# Patient Record
Sex: Female | Born: 1995 | Race: White | Hispanic: No | Marital: Single | Smoking: Never smoker
Health system: Southern US, Community
[De-identification: ages and names within clinical notes are randomized; demographics above are authoritative.]

---
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
## 2018-08-30 DIAGNOSIS — Z79899 Other long term (current) drug therapy: Secondary | ICD-10-CM | POA: Diagnosis not present

## 2018-08-30 DIAGNOSIS — L709 Acne, unspecified: Secondary | ICD-10-CM | POA: Diagnosis not present

## 2019-02-25 DIAGNOSIS — J020 Streptococcal pharyngitis: Secondary | ICD-10-CM | POA: Diagnosis not present

## 2019-02-25 DIAGNOSIS — H6121 Impacted cerumen, right ear: Secondary | ICD-10-CM | POA: Diagnosis not present

## 2019-02-25 DIAGNOSIS — J029 Acute pharyngitis, unspecified: Secondary | ICD-10-CM | POA: Diagnosis not present

## 2019-02-28 DIAGNOSIS — L709 Acne, unspecified: Secondary | ICD-10-CM | POA: Diagnosis not present

## 2019-08-29 DIAGNOSIS — L700 Acne vulgaris: Secondary | ICD-10-CM | POA: Diagnosis not present

## 2019-12-05 DIAGNOSIS — B354 Tinea corporis: Secondary | ICD-10-CM | POA: Diagnosis not present

## 2020-02-28 DIAGNOSIS — L700 Acne vulgaris: Secondary | ICD-10-CM | POA: Diagnosis not present

## 2020-04-19 ENCOUNTER — Encounter: Payer: Self-pay | Admitting: Obstetrics & Gynecology

## 2020-04-19 ENCOUNTER — Ambulatory Visit (INDEPENDENT_AMBULATORY_CARE_PROVIDER_SITE_OTHER): Payer: BC Managed Care – PPO | Admitting: Obstetrics & Gynecology

## 2020-04-19 ENCOUNTER — Other Ambulatory Visit (HOSPITAL_COMMUNITY)
Admission: RE | Admit: 2020-04-19 | Discharge: 2020-04-19 | Disposition: A | Discharge status: Home / Self Care | Payer: BC Managed Care – PPO | Source: Ambulatory Visit | Attending: Obstetrics & Gynecology | Admitting: Obstetrics & Gynecology

## 2020-04-19 ENCOUNTER — Other Ambulatory Visit: Payer: Self-pay

## 2020-04-19 VITALS — BP 93/48 | HR 80 | Ht 68.0 in | Wt 130.0 lb

## 2020-04-19 DIAGNOSIS — Z113 Encounter for screening for infections with a predominantly sexual mode of transmission: Secondary | ICD-10-CM | POA: Insufficient documentation

## 2020-04-19 DIAGNOSIS — Z3009 Encounter for other general counseling and advice on contraception: Secondary | ICD-10-CM | POA: Diagnosis not present

## 2020-04-19 DIAGNOSIS — Z0000 Encounter for general adult medical examination without abnormal findings: Secondary | ICD-10-CM

## 2020-04-19 NOTE — Progress Notes (Signed)
Subjective:     Kristin Friedman is a 25 y.o. female here for a routine exam.  Current complaints: This is pts first GYN exam. She is sexually active. She has been on OCPs since junior year of HS. She is healthy and has no h/o STIs.  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 Her mother is a pt her and referred her.     Gynecologic History Patient's last menstrual period was 04/09/2020 (exact date). Contraception: OCP (estrogen/progesterone) Last Pap: n/a Last mammogram: n/a  Obstetric History OB History  Gravida Para Term Preterm AB Living  0 0 0 0 0 0  SAB IAB Ectopic Multiple Live Births  0 0 0 0 0    The following portions of the patient's history were reviewed and updated as appropriate: allergies, current medications, past family history, past medical history, past social history, past surgical history and problem list.  Review of Systems Pertinent items are noted in HPI.    Objective:  BP (!) 93/48    Pulse 80    Ht 5\' 8"  (1.727 m)    Wt 130 lb (59 kg)    LMP 04/09/2020 (Exact Date)    BMI 19.77 kg/m  General Appearance:    Alert, cooperative, no distress, appears stated age  Head:    Normocephalic, without obvious abnormality, atraumatic  Eyes:    conjunctiva/corneas clear, EOM's intact, both eyes  Ears:    Normal external ear canals, both ears  Nose:   Nares normal, septum midline, mucosa normal, no drainage    or sinus tenderness  Throat:   Lips, mucosa, and tongue normal; teeth and gums normal  Neck:   Supple, symmetrical, trachea midline, no adenopathy;    thyroid:  no enlargement/tenderness/nodules  Back:     Symmetric, no curvature, ROM normal, no CVA tenderness  Lungs:     respirations unlabored  Chest Wall:    No tenderness or deformity   Heart:    Regular rate and rhythm  Breast Exam:    No tenderness, masses, or nipple abnormality  Abdomen:     Soft, non-tender, bowel sounds active all four quadrants,    no masses, no organomegaly   Genitalia:    Normal female without lesion, discharge or tenderness     Extremities:   Extremities normal, atraumatic, no cyanosis or edema  Pulses:   2+ and symmetric all extremities  Skin:   Skin color, texture, turgor normal, no rashes or lesions    Assessment:    Healthy female exam.   Contraception management counseling. Pt had questions about being on OCPs for an extended period of time. She denies problems. Her dermatologist prescribes her OCPs. She has 6 refills left.  STI screen    Plan:   Diagnoses and all orders for this visit:  Encounter for well woman exam without gynecological exam -     Cytology - PAP( [place])  Screening for STD (sexually transmitted disease) -     Cytology - PAP( [place])  Encounter for counseling regarding contraception  f/u in 1 year or sooner prn  Kristin Friedman L. Harraway-Smith, M.D., 06/07/2020

## 2020-04-23 LAB — CYTOLOGY - PAP
Chlamydia: NEGATIVE
Comment: NEGATIVE
Comment: NORMAL
Diagnosis: NEGATIVE
Neisseria Gonorrhea: NEGATIVE

## 2020-05-26 DIAGNOSIS — Z20822 Contact with and (suspected) exposure to covid-19: Secondary | ICD-10-CM | POA: Diagnosis not present

## 2020-05-26 DIAGNOSIS — R0981 Nasal congestion: Secondary | ICD-10-CM | POA: Diagnosis not present

## 2020-05-26 DIAGNOSIS — J309 Allergic rhinitis, unspecified: Secondary | ICD-10-CM | POA: Diagnosis not present
